# Patient Record
Sex: Female | Born: 1960 | Race: White | Hispanic: No | Marital: Single | Smoking: Current every day smoker
Health system: Southern US, Community
[De-identification: ages and names within clinical notes are randomized; demographics above are authoritative.]

## PROBLEM LIST (undated history)

## (undated) HISTORY — PX: NECK SURGERY: SHX720

## (undated) HISTORY — PX: ABDOMINAL HYSTERECTOMY: SHX81

## (undated) HISTORY — PX: BACK SURGERY: SHX140

---

## 2014-07-30 ENCOUNTER — Emergency Department (HOSPITAL_COMMUNITY)
Admission: EM | Admit: 2014-07-30 | Discharge: 2014-07-30 | Disposition: A | Discharge status: Home / Self Care | Payer: Self-pay | Attending: Emergency Medicine | Admitting: Emergency Medicine

## 2014-07-30 ENCOUNTER — Emergency Department (HOSPITAL_COMMUNITY): Payer: Self-pay

## 2014-07-30 ENCOUNTER — Encounter (HOSPITAL_COMMUNITY): Payer: Self-pay | Admitting: Emergency Medicine

## 2014-07-30 DIAGNOSIS — S3992XA Unspecified injury of lower back, initial encounter: Secondary | ICD-10-CM | POA: Insufficient documentation

## 2014-07-30 DIAGNOSIS — Y9389 Activity, other specified: Secondary | ICD-10-CM | POA: Insufficient documentation

## 2014-07-30 DIAGNOSIS — W19XXXA Unspecified fall, initial encounter: Secondary | ICD-10-CM

## 2014-07-30 DIAGNOSIS — Y9289 Other specified places as the place of occurrence of the external cause: Secondary | ICD-10-CM | POA: Insufficient documentation

## 2014-07-30 DIAGNOSIS — M545 Low back pain, unspecified: Secondary | ICD-10-CM

## 2014-07-30 DIAGNOSIS — W1830XA Fall on same level, unspecified, initial encounter: Secondary | ICD-10-CM | POA: Insufficient documentation

## 2014-07-30 MED ORDER — HYDROCODONE-ACETAMINOPHEN 5-325 MG PO TABS: 1.0000 | ORAL_TABLET | Freq: Four times a day (QID) | ORAL | Status: AC | PRN

## 2014-07-30 MED ORDER — CYCLOBENZAPRINE HCL 5 MG PO TABS: 5.0000 mg | ORAL_TABLET | Freq: Two times a day (BID) | ORAL | Status: AC | PRN

## 2014-07-30 MED ORDER — HYDROCODONE-ACETAMINOPHEN 5-325 MG PO TABS
1.0000 | ORAL_TABLET | Freq: Once | ORAL | Status: AC
  Administered 2014-07-30: 1 via ORAL
  Filled 2014-07-30: qty 1

## 2014-07-30 NOTE — ED Provider Notes (Signed)
Medical screening examination/treatment/procedure(s) were performed by non-physician practitioner and as supervising physician I was immediately available for consultation/collaboration.   EKG Interpretation None        Megan Hayduk H Elgie Landino, MD 07/30/14 2337 

## 2014-07-30 NOTE — ED Provider Notes (Signed)
CSN: [id number]     Arrival date & time 07/30/14  1840 History   None    This chart was scribed for non-physician practitioner, Teressa LowerVrinda Zaydrian Batta, NP working with Richardean Canalavid H Yao, MD by Arlan OrganAshley Leger, ED Scribe. This patient was seen in room WTR6/WTR6 and the patient's care was started at 7:06 PM.   No chief complaint on file.  The history is provided by the patient. No language interpreter was used.    HPI Comments: Christine Pennington is a 53 y.o. female who presents to the Emergency Department complaining of constant, moderate back pain x 2 hours that is unchanged. Pt states she was helping her daughter move and fell landing on her bottom approximately 1 hour prior to arrival. She denies any head trauma or LOC. Pain is exacerbated with certain movements and palpation. She has tried OTC Aspirin without any improvement for symptoms. She denies taking any medications for chronic pain. No fever or chills. No weakness or loss of sensation. PSHx included back and neck surgery 26 years ago. No known allergies to medications.  No past medical history on file. No past surgical history on file. No family history on file. History  Substance Use Topics  . Smoking status: Not on file  . Smokeless tobacco: Not on file  . Alcohol Use: Not on file   OB History   No data available     Review of Systems  Constitutional: Negative for fever and chills.  Musculoskeletal: Positive for back pain.  Neurological: Negative for weakness and numbness.      Allergies  Review of patient's allergies indicates not on file.  Home Medications   Prior to Admission medications   Not on File   Triage Vitals: BP 151/91  Pulse 89  Temp(Src) 97.9 F (36.6 C) (Oral)  Resp 22  SpO2 100%   Physical Exam  Nursing note and vitals reviewed. Constitutional: She appears well-developed and well-nourished. No distress.  HENT:  Head: Normocephalic and atraumatic.  Neck: Neck supple.  Pulmonary/Chest: Effort normal.   Musculoskeletal: She exhibits tenderness.  Lumbar and sacral spinal tenderness Moving all extremities without difficulty   Neurological: She is alert.  Skin: She is not diaphoretic.    ED Course  Procedures (including critical care time)  DIAGNOSTIC STUDIES: Oxygen Saturation is 100% on RA, Normal by my interpretation.    COORDINATION OF CARE: 7:07 PM- Will give Norco here in ED. Will order DG lumbar spine compete and DG sacrum/coccyx. Discussed treatment plan with pt at bedside and pt agreed to plan.     Labs Review Labs Reviewed - No data to display  Imaging Review Dg Lumbar Spine Complete  07/30/2014   CLINICAL DATA:  Larey SeatFell today at home.  EXAM: LUMBAR SPINE - COMPLETE 4+ VIEW; SACRUM AND COCCYX - 2+ VIEW  COMPARISON:  None.  FINDINGS: Lumbar spine:  Normal alignment of the lumbar vertebral bodies. Disc spaces and vertebral bodies are maintained. The facets are normally aligned. No pars defects. The visualized bony pelvis is intact.  Sacrum/ coccyx:  The pubic symphysis and SI joints are intact. No definite acute sacral or coccyx fracture.  IMPRESSION: No acute bony findings.   Electronically Signed   By: Loralie ChampagneMark  Gallerani M.D.   On: 07/30/2014 19:53   Dg Sacrum/coccyx  07/30/2014   CLINICAL DATA:  Larey SeatFell today at home.  EXAM: LUMBAR SPINE - COMPLETE 4+ VIEW; SACRUM AND COCCYX - 2+ VIEW  COMPARISON:  None.  FINDINGS: Lumbar spine:  Normal alignment of  the lumbar vertebral bodies. Disc spaces and vertebral bodies are maintained. The facets are normally aligned. No pars defects. The visualized bony pelvis is intact.  Sacrum/ coccyx:  The pubic symphysis and SI joints are intact. No definite acute sacral or coccyx fracture.  IMPRESSION: No acute bony findings.   Electronically Signed   By: Loralie ChampagneMark  Gallerani M.D.   On: 07/30/2014 19:53     EKG Interpretation None      MDM   Final diagnoses:  Midline low back pain without sciatica  Fall, initial encounter    Neurologically intact.  No acute bony abnormality noted. Will treat symptomatically with pain medication and flexeril  I personally performed the services described in this documentation, which was scribed in my presence. The recorded information has been reviewed and is accurate.    Teressa LowerVrinda Delmore Sear, NP 07/30/14 2007

## 2014-07-30 NOTE — ED Notes (Signed)
Per pt report: pt was helping daughter move and fell.  Pt landed on her bottom about an hour ago.  Since then the pt's back has been in pain.  Pt ambulatory.  Pt hx back and neck surgery.

## 2014-07-30 NOTE — Discharge Instructions (Signed)
Back Pain, Adult Low back pain is very common. About 1 in 5 people have back pain.The cause of low back pain is rarely dangerous. The pain often gets better over time.About half of people with a sudden onset of back pain feel better in just 2 weeks. About 8 in 10 people feel better by 6 weeks.  CAUSES Some common causes of back pain include:  Strain of the muscles or ligaments supporting the spine.  Wear and tear (degeneration) of the spinal discs.  Arthritis.  Direct injury to the back. DIAGNOSIS Most of the time, the direct cause of low back pain is not known.However, back pain can be treated effectively even when the exact cause of the pain is unknown.Answering your caregiver's questions about your overall health and symptoms is one of the most accurate ways to make sure the cause of your pain is not dangerous. If your caregiver needs more information, he or she may order lab work or imaging tests (X-rays or MRIs).However, even if imaging tests show changes in your back, this usually does not require surgery. HOME CARE INSTRUCTIONS For many people, back pain returns.Since low back pain is rarely dangerous, it is often a condition that people can learn to manageon their own.   Remain active. It is stressful on the back to sit or stand in one place. Do not sit, drive, or stand in one place for more than 30 minutes at a time. Take short walks on level surfaces as soon as pain allows.Try to increase the length of time you walk each day.  Do not stay in bed.Resting more than 1 or 2 days can delay your recovery.  Do not avoid exercise or work.Your body is made to move.It is not dangerous to be active, even though your back may hurt.Your back will likely heal faster if you return to being active before your pain is gone.  Pay attention to your body when you bend and lift. Many people have less discomfortwhen lifting if they bend their knees, keep the load close to their bodies,and  avoid twisting. Often, the most comfortable positions are those that put less stress on your recovering back.  Find a comfortable position to sleep. Use a firm mattress and lie on your side with your knees slightly bent. If you lie on your back, put a pillow under your knees.  Only take over-the-counter or prescription medicines as directed by your caregiver. Over-the-counter medicines to reduce pain and inflammation are often the most helpful.Your caregiver may prescribe muscle relaxant drugs.These medicines help dull your pain so you can more quickly return to your normal activities and healthy exercise.  Put ice on the injured area.  Put ice in a plastic bag.  Place a towel between your skin and the bag.  Leave the ice on for 15-20 minutes, 03-04 times a day for the first 2 to 3 days. After that, ice and heat may be alternated to reduce pain and spasms.  Ask your caregiver about trying back exercises and gentle massage. This may be of some benefit.  Avoid feeling anxious or stressed.Stress increases muscle tension and can worsen back pain.It is important to recognize when you are anxious or stressed and learn ways to manage it.Exercise is a great option. SEEK MEDICAL CARE IF:  You have pain that is not relieved with rest or medicine.  You have pain that does not improve in 1 week.  You have new symptoms.  You are generally not feeling well. SEEK   IMMEDIATE MEDICAL CARE IF:   You have pain that radiates from your back into your legs.  You develop new bowel or bladder control problems.  You have unusual weakness or numbness in your arms or legs.  You develop nausea or vomiting.  You develop abdominal pain.  You feel faint. Document Released: 10/06/2005 Document Revised: 04/06/2012 Document Reviewed: 02/07/2014 Exit[employer] Patient Information 2015 [employer]. This information is not intended to replace advice given to you by your health care provider. Make sure you  discuss any questions you have with your health care provider.  

## 2014-08-16 ENCOUNTER — Emergency Department (HOSPITAL_COMMUNITY)
Admission: EM | Admit: 2014-08-16 | Discharge: 2014-08-16 | Disposition: A | Discharge status: Home / Self Care | Payer: Self-pay | Attending: Emergency Medicine | Admitting: Emergency Medicine

## 2014-08-16 ENCOUNTER — Emergency Department (HOSPITAL_COMMUNITY): Payer: Self-pay

## 2014-08-16 ENCOUNTER — Encounter (HOSPITAL_COMMUNITY): Payer: Self-pay | Admitting: Emergency Medicine

## 2014-08-16 DIAGNOSIS — Z720 Tobacco use: Secondary | ICD-10-CM | POA: Insufficient documentation

## 2014-08-16 DIAGNOSIS — W501XXA Accidental kick by another person, initial encounter: Secondary | ICD-10-CM | POA: Insufficient documentation

## 2014-08-16 DIAGNOSIS — Y9372 Activity, wrestling: Secondary | ICD-10-CM | POA: Insufficient documentation

## 2014-08-16 DIAGNOSIS — S7001XA Contusion of right hip, initial encounter: Secondary | ICD-10-CM | POA: Insufficient documentation

## 2014-08-16 DIAGNOSIS — Y9289 Other specified places as the place of occurrence of the external cause: Secondary | ICD-10-CM | POA: Insufficient documentation

## 2014-08-16 MED ORDER — NAPROXEN 500 MG PO TABS: 500.0000 mg | ORAL_TABLET | Freq: Two times a day (BID) | ORAL | Status: AC

## 2014-08-16 NOTE — Discharge Instructions (Signed)
Contusion °A contusion is a deep bruise. Contusions are the result of an injury that caused bleeding under the skin. The contusion may turn blue, purple, or yellow. Minor injuries will give you a painless contusion, but more severe contusions may stay painful and swollen for a few weeks.  °CAUSES  °A contusion is usually caused by a blow, trauma, or direct force to an area of the body. °SYMPTOMS  °· Swelling and redness of the injured area. °· Bruising of the injured area. °· Tenderness and soreness of the injured area. °· Pain. °DIAGNOSIS  °The diagnosis can be made by taking a history and physical exam. An X-ray, CT scan, or MRI may be needed to determine if there were any associated injuries, such as fractures. °TREATMENT  °Specific treatment will depend on what area of the body was injured. In general, the best treatment for a contusion is resting, icing, elevating, and applying cold compresses to the injured area. Over-the-counter medicines may also be recommended for pain control. Ask your caregiver what the best treatment is for your contusion. °HOME CARE INSTRUCTIONS  °· Put ice on the injured area. °¨ Put ice in a plastic bag. °¨ Place a towel between your skin and the bag. °¨ Leave the ice on for 15-20 minutes, 3-4 times a day, or as directed by your health care provider. °· Only take over-the-counter or prescription medicines for pain, discomfort, or fever as directed by your caregiver. Your caregiver may recommend avoiding anti-inflammatory medicines (aspirin, ibuprofen, and naproxen) for 48 hours because these medicines may increase bruising. °· Rest the injured area. °· If possible, elevate the injured area to reduce swelling. °SEEK IMMEDIATE MEDICAL CARE IF:  °· You have increased bruising or swelling. °· You have pain that is getting worse. °· Your swelling or pain is not relieved with medicines. °MAKE SURE YOU:  °· Understand these instructions. °· Will watch your condition. °· Will get help right  away if you are not doing well or get worse. °Document Released: 07/16/2005 Document Revised: 10/11/2013 Document Reviewed: 08/11/2011 °Exit[employer]® Patient Information ©2015 [employer]. This information is not intended to replace advice given to you by your health care provider. Make sure you discuss any questions you have with your health care provider. ° °

## 2014-08-16 NOTE — ED Provider Notes (Signed)
Medical screening examination/treatment/procedure(s) were performed by non-physician practitioner and as supervising physician I was immediately available for consultation/collaboration.    Deondra Wigger, MD 08/16/14 2321 
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
## 2015-06-11 IMAGING — CR DG LUMBAR SPINE COMPLETE 4+V
5 series · 5 of 5 positions shown · non-contrast
Comparison: None.

CLINICAL DATA: Fell today at home.

EXAM:
LUMBAR SPINE - COMPLETE 4+ VIEW; SACRUM AND COCCYX - 2+ VIEW

[t lumbar spine ap]
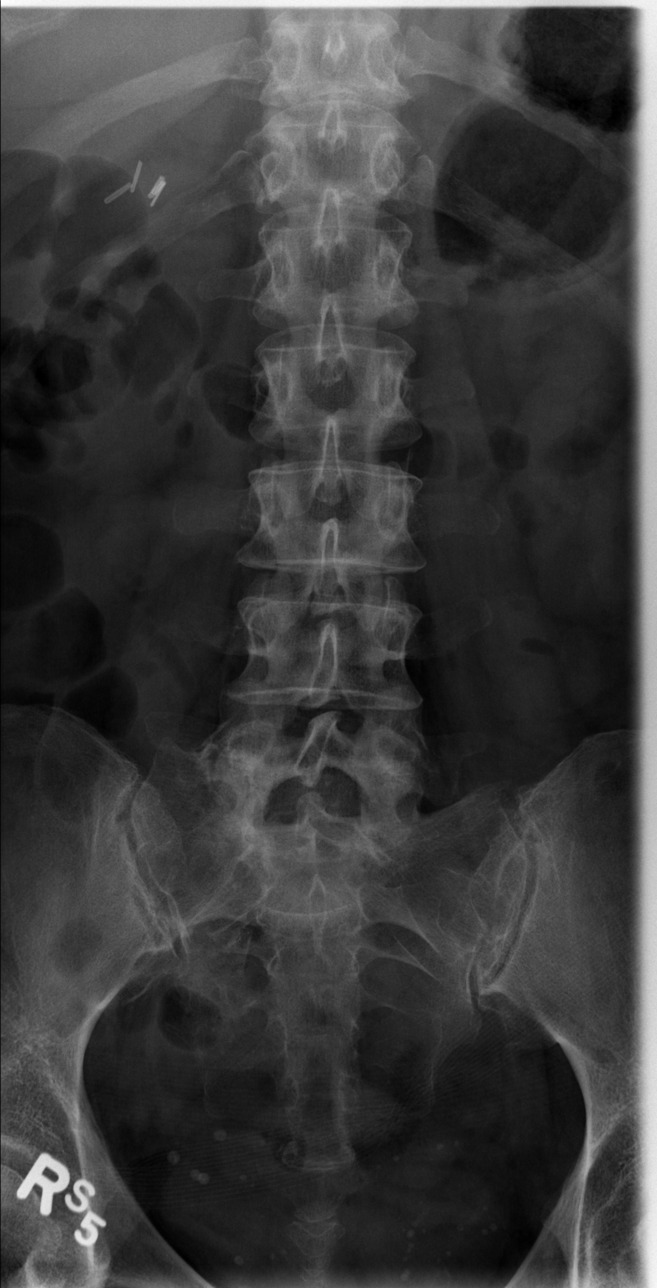

[t lumbar spine obl (1 of 2)]
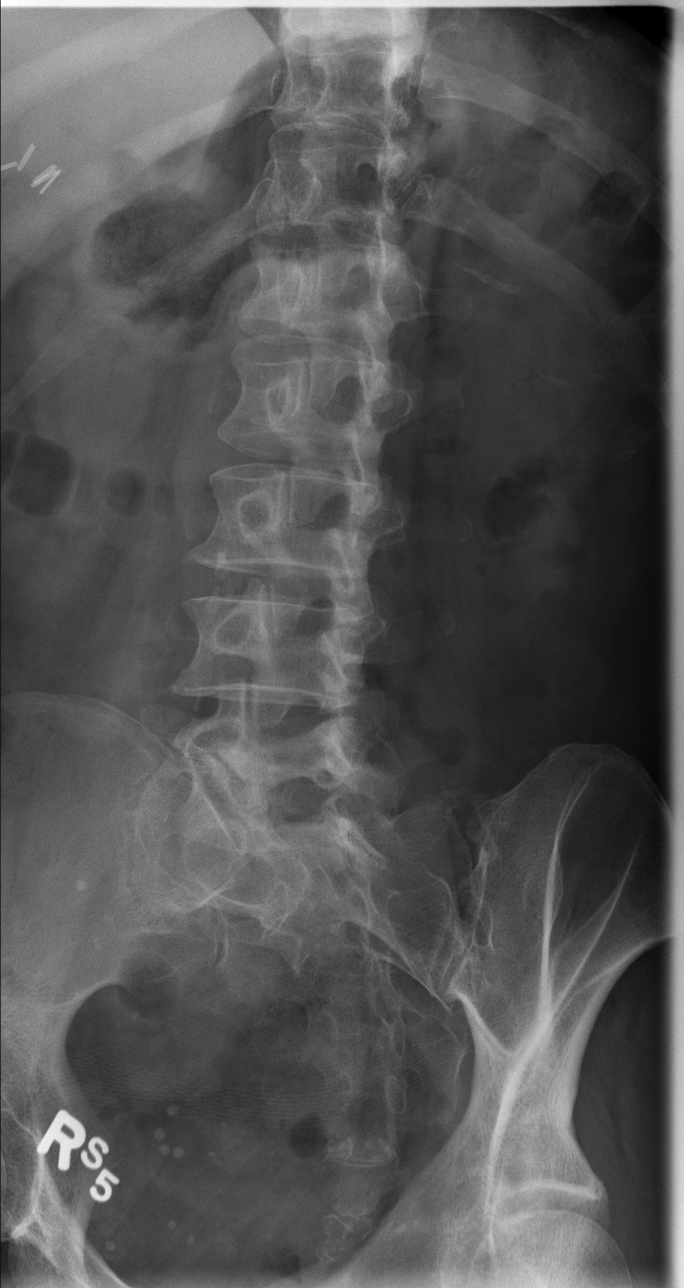

[t lumbar spine obl (2 of 2)]
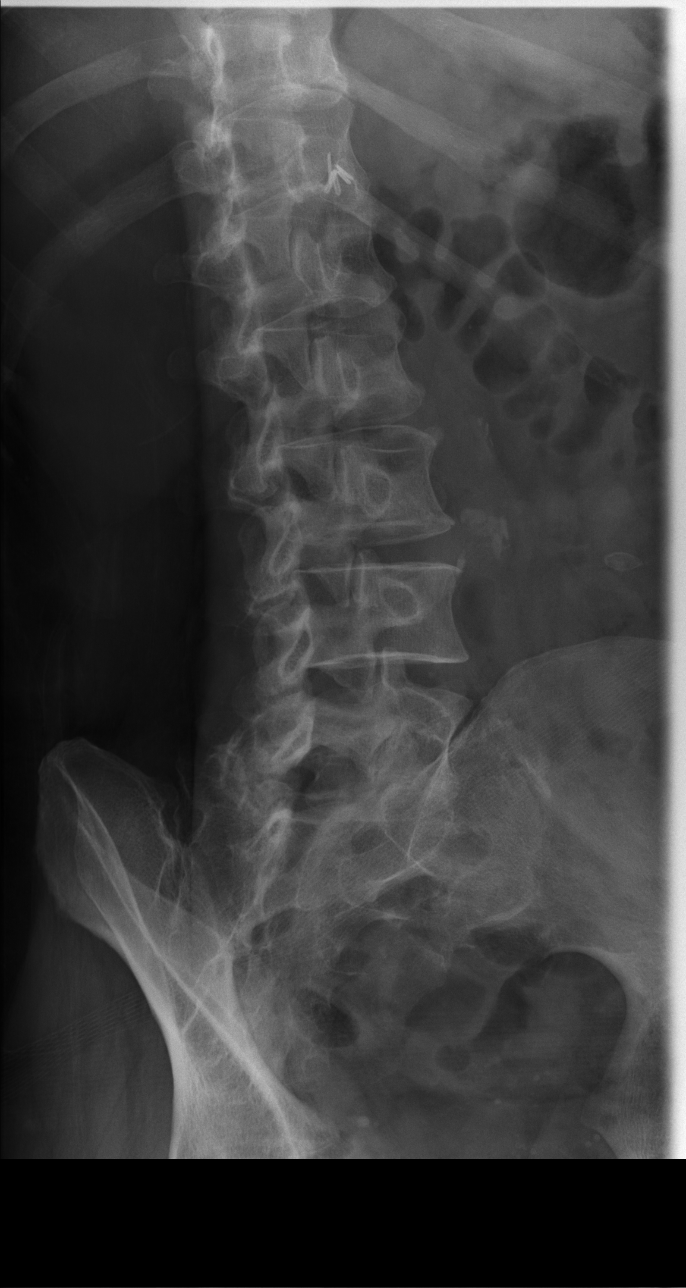

[t lumbar spine lat]
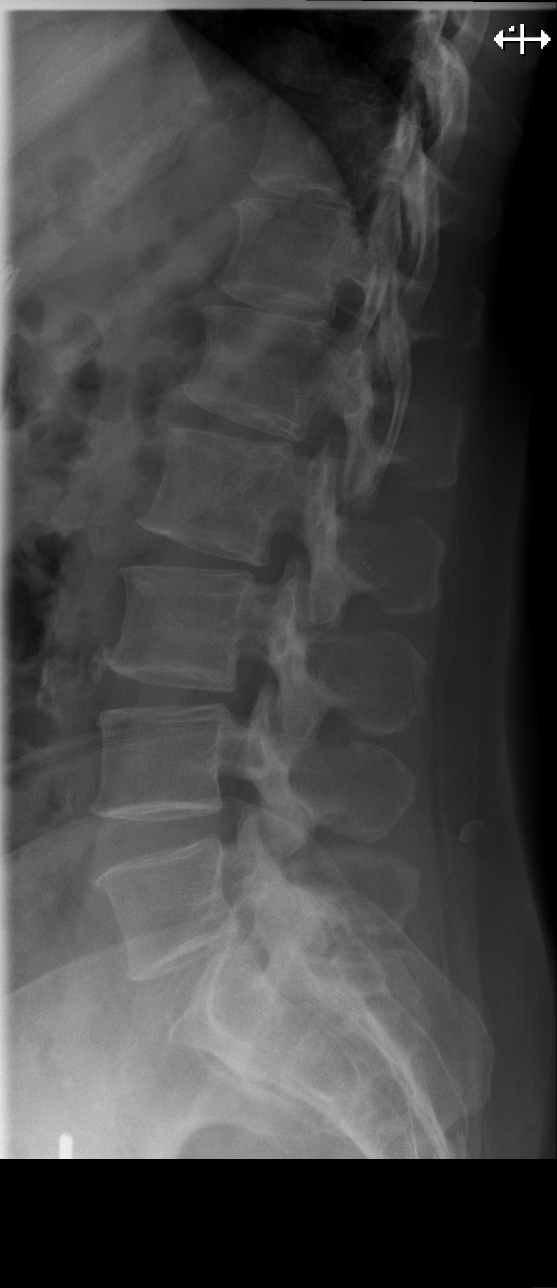

[t lumbar l-5 s-1 spot]
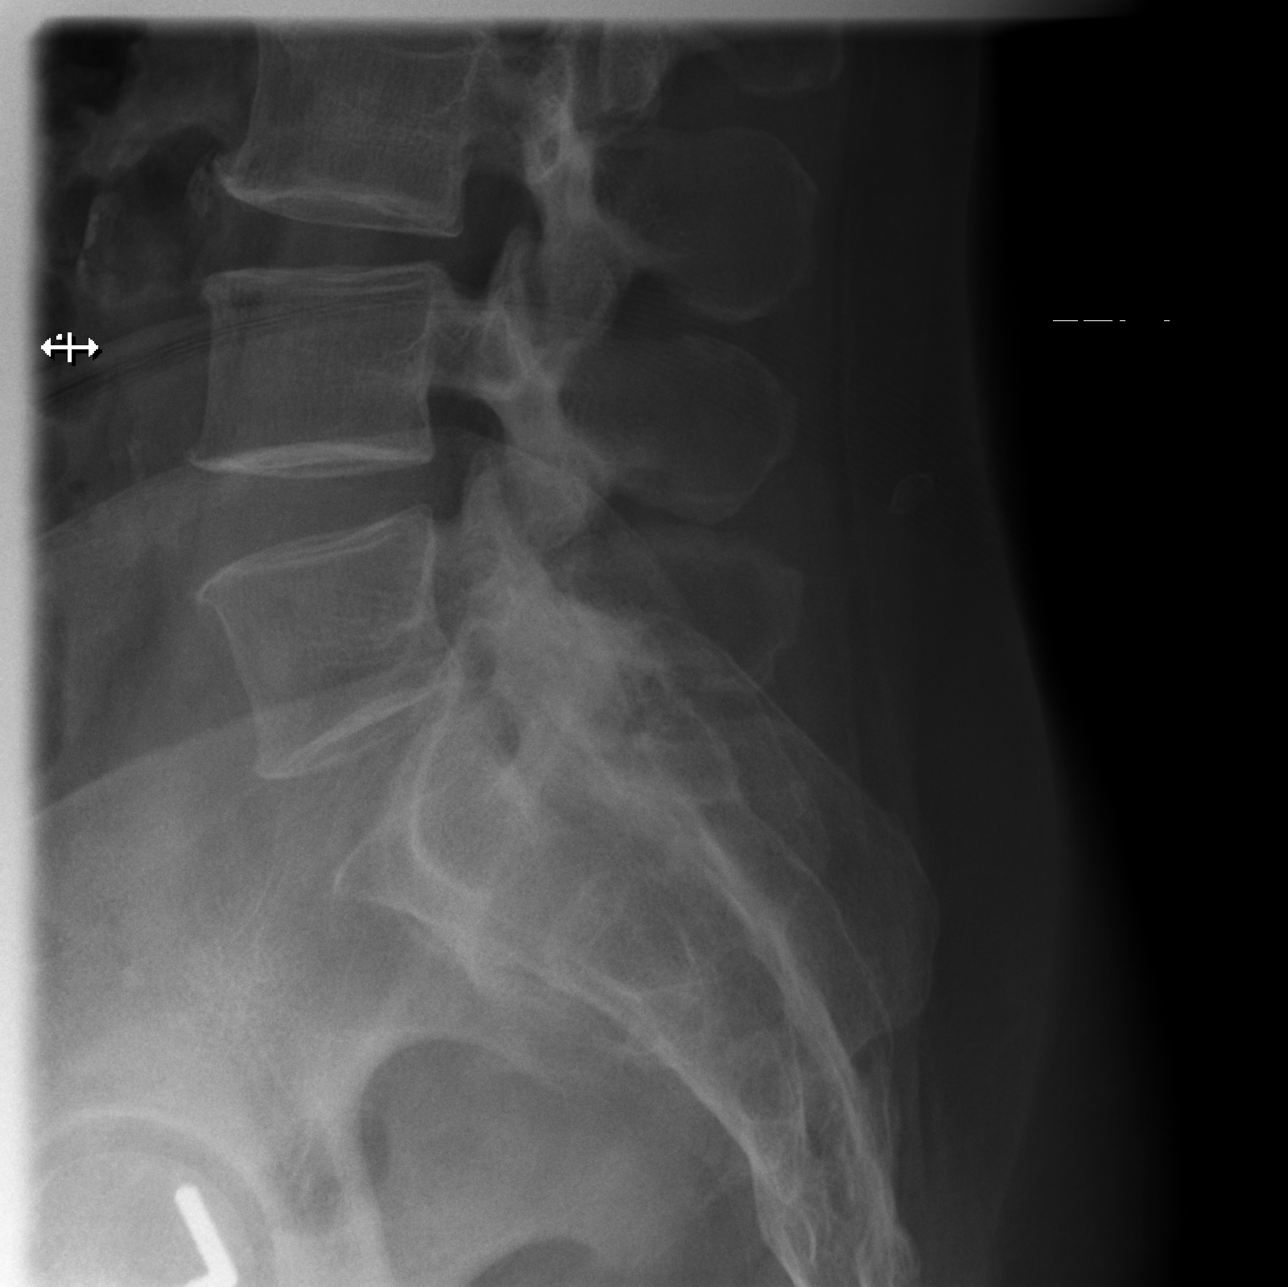

[5 of 5 positions shown; findings below may reference images not displayed]

FINDINGS: Lumbar spine:

Normal alignment of the lumbar vertebral bodies. Disc spaces and
vertebral bodies are maintained. The facets are normally aligned. No
pars defects. The visualized bony pelvis is intact.

Sacrum/ coccyx:

The pubic symphysis and SI joints are intact. No definite acute
sacral or coccyx fracture.
IMPRESSION: No acute bony findings.
